# Patient Record
Sex: Female | Born: 1970 | Race: White | Hispanic: No | Marital: Married
Health system: Southern US, Community
[De-identification: ages and names within clinical notes are randomized; demographics above are authoritative.]

---

## 2010-06-01 ENCOUNTER — Other Ambulatory Visit (HOSPITAL_COMMUNITY)
Admission: RE | Admit: 2010-06-01 | Discharge: 2010-06-01 | Disposition: A | Discharge status: Home / Self Care | Payer: PRIVATE HEALTH INSURANCE | Source: Ambulatory Visit | Attending: Obstetrics and Gynecology | Admitting: Obstetrics and Gynecology

## 2010-06-01 DIAGNOSIS — Z113 Encounter for screening for infections with a predominantly sexual mode of transmission: Secondary | ICD-10-CM | POA: Insufficient documentation

## 2010-06-01 DIAGNOSIS — Z01419 Encounter for gynecological examination (general) (routine) without abnormal findings: Secondary | ICD-10-CM | POA: Insufficient documentation

## 2010-09-11 ENCOUNTER — Other Ambulatory Visit: Payer: Self-pay | Admitting: Obstetrics and Gynecology

## 2010-09-11 DIAGNOSIS — E2839 Other primary ovarian failure: Secondary | ICD-10-CM

## 2010-09-14 ENCOUNTER — Ambulatory Visit
Admission: RE | Admit: 2010-09-14 | Discharge: 2010-09-14 | Disposition: A | Discharge status: Home / Self Care | Payer: PRIVATE HEALTH INSURANCE | Source: Ambulatory Visit | Attending: Obstetrics and Gynecology | Admitting: Obstetrics and Gynecology

## 2010-09-14 DIAGNOSIS — E2839 Other primary ovarian failure: Secondary | ICD-10-CM

## 2012-02-04 ENCOUNTER — Other Ambulatory Visit: Payer: Self-pay | Admitting: Obstetrics and Gynecology

## 2012-02-04 DIAGNOSIS — Z1231 Encounter for screening mammogram for malignant neoplasm of breast: Secondary | ICD-10-CM

## 2012-03-05 ENCOUNTER — Ambulatory Visit
Admission: RE | Admit: 2012-03-05 | Discharge: 2012-03-05 | Disposition: A | Discharge status: Home / Self Care | Payer: BC Managed Care – PPO | Source: Ambulatory Visit | Attending: Obstetrics and Gynecology | Admitting: Obstetrics and Gynecology

## 2012-03-05 DIAGNOSIS — Z1231 Encounter for screening mammogram for malignant neoplasm of breast: Secondary | ICD-10-CM

## 2013-03-23 ENCOUNTER — Other Ambulatory Visit: Payer: Self-pay | Admitting: Obstetrics and Gynecology

## 2013-03-23 ENCOUNTER — Other Ambulatory Visit (HOSPITAL_COMMUNITY)
Admission: RE | Admit: 2013-03-23 | Discharge: 2013-03-23 | Disposition: A | Discharge status: Home / Self Care | Payer: BC Managed Care – PPO | Source: Ambulatory Visit | Attending: Obstetrics and Gynecology | Admitting: Obstetrics and Gynecology

## 2013-03-23 DIAGNOSIS — Z01419 Encounter for gynecological examination (general) (routine) without abnormal findings: Secondary | ICD-10-CM | POA: Insufficient documentation

## 2013-03-23 DIAGNOSIS — Z1151 Encounter for screening for human papillomavirus (HPV): Secondary | ICD-10-CM | POA: Insufficient documentation

## 2013-04-01 ENCOUNTER — Other Ambulatory Visit: Payer: Self-pay | Admitting: Physician Assistant

## 2013-04-01 ENCOUNTER — Ambulatory Visit
Admission: RE | Admit: 2013-04-01 | Discharge: 2013-04-01 | Disposition: A | Discharge status: Home / Self Care | Payer: BC Managed Care – PPO | Source: Ambulatory Visit | Attending: Physician Assistant | Admitting: Physician Assistant

## 2013-04-01 DIAGNOSIS — R52 Pain, unspecified: Secondary | ICD-10-CM

## 2013-08-25 ENCOUNTER — Other Ambulatory Visit: Payer: Self-pay | Admitting: Physician Assistant

## 2013-08-25 ENCOUNTER — Ambulatory Visit
Admission: RE | Admit: 2013-08-25 | Discharge: 2013-08-25 | Disposition: A | Discharge status: Home / Self Care | Payer: BC Managed Care – PPO | Source: Ambulatory Visit | Attending: Physician Assistant | Admitting: Physician Assistant

## 2013-08-25 DIAGNOSIS — M25562 Pain in left knee: Secondary | ICD-10-CM

## 2014-06-02 ENCOUNTER — Other Ambulatory Visit: Payer: Self-pay | Admitting: Family Medicine

## 2014-06-02 ENCOUNTER — Ambulatory Visit
Admission: RE | Admit: 2014-06-02 | Discharge: 2014-06-02 | Disposition: A | Discharge status: Home / Self Care | Payer: BLUE CROSS/BLUE SHIELD | Source: Ambulatory Visit | Attending: Family Medicine | Admitting: Family Medicine

## 2014-06-02 DIAGNOSIS — R52 Pain, unspecified: Secondary | ICD-10-CM

## 2014-08-16 ENCOUNTER — Other Ambulatory Visit: Payer: Self-pay

## 2014-08-16 DIAGNOSIS — Z1231 Encounter for screening mammogram for malignant neoplasm of breast: Secondary | ICD-10-CM

## 2014-09-22 ENCOUNTER — Ambulatory Visit
Admission: RE | Admit: 2014-09-22 | Discharge: 2014-09-22 | Disposition: A | Discharge status: Home / Self Care | Payer: BLUE CROSS/BLUE SHIELD | Source: Ambulatory Visit

## 2014-09-22 DIAGNOSIS — Z1231 Encounter for screening mammogram for malignant neoplasm of breast: Secondary | ICD-10-CM

## 2015-10-08 IMAGING — CR DG TOE GREAT 2+V*R*
3 series · 3 of 3 positions shown · non-contrast
Comparison: None.

CLINICAL DATA: Distal pain, question prior fracture 6196, no recent
trauma

EXAM:
RIGHT GREAT TOE

[t toes ap right]
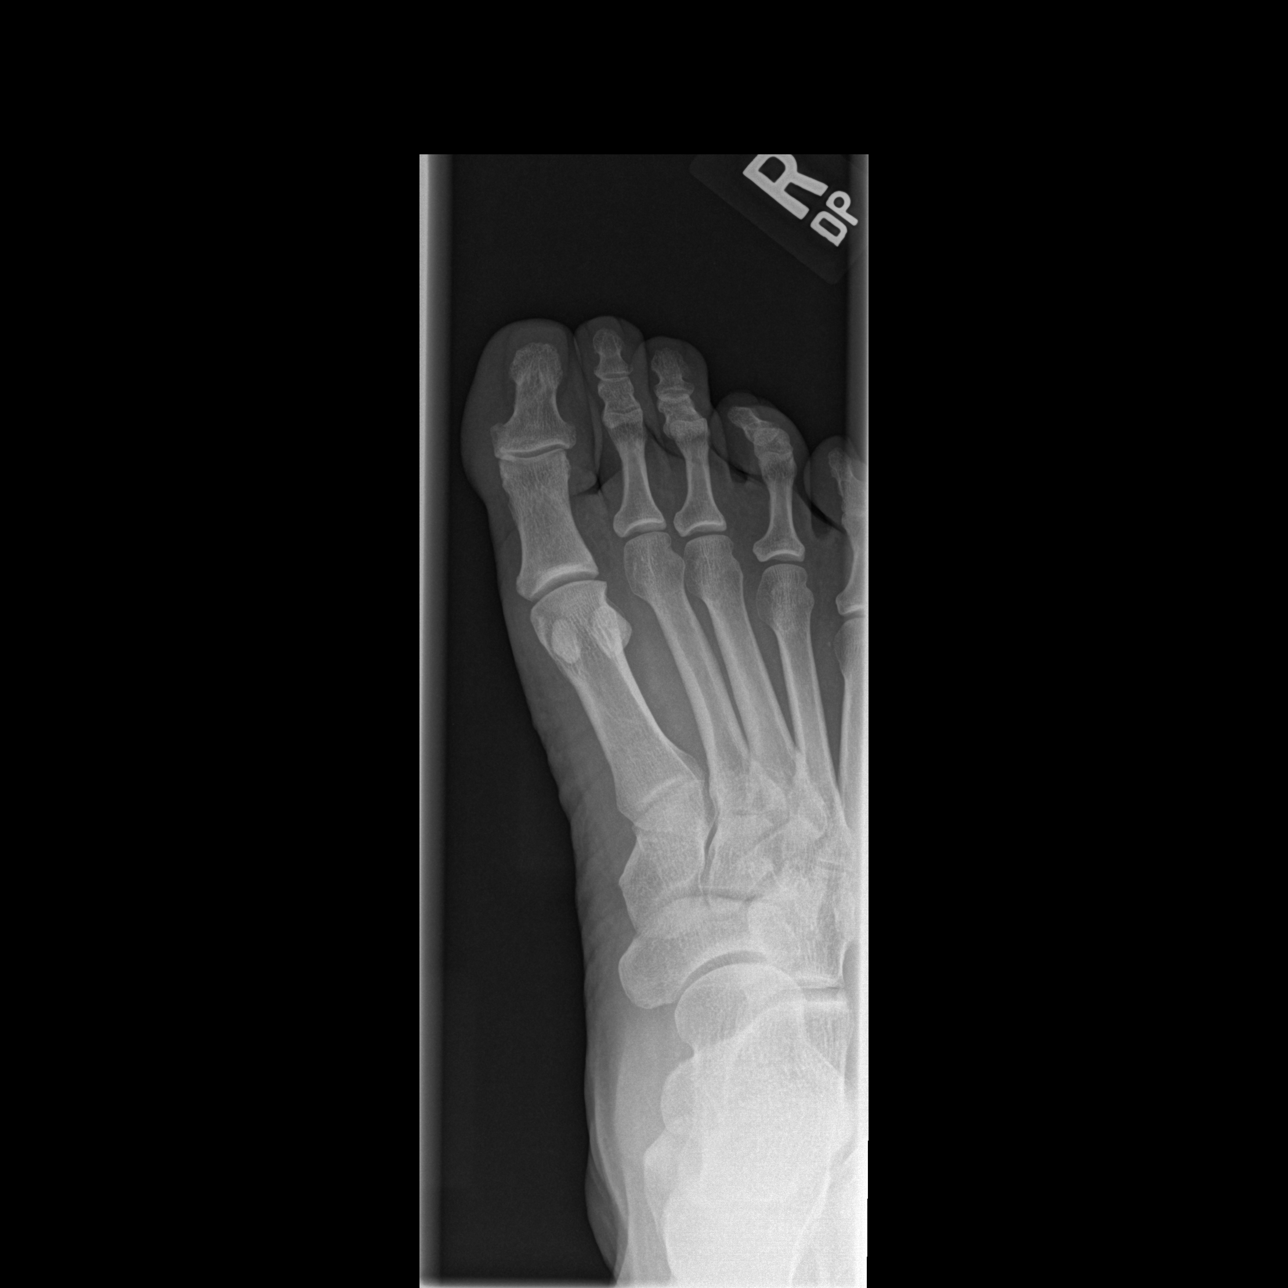

[t toes oblique right]
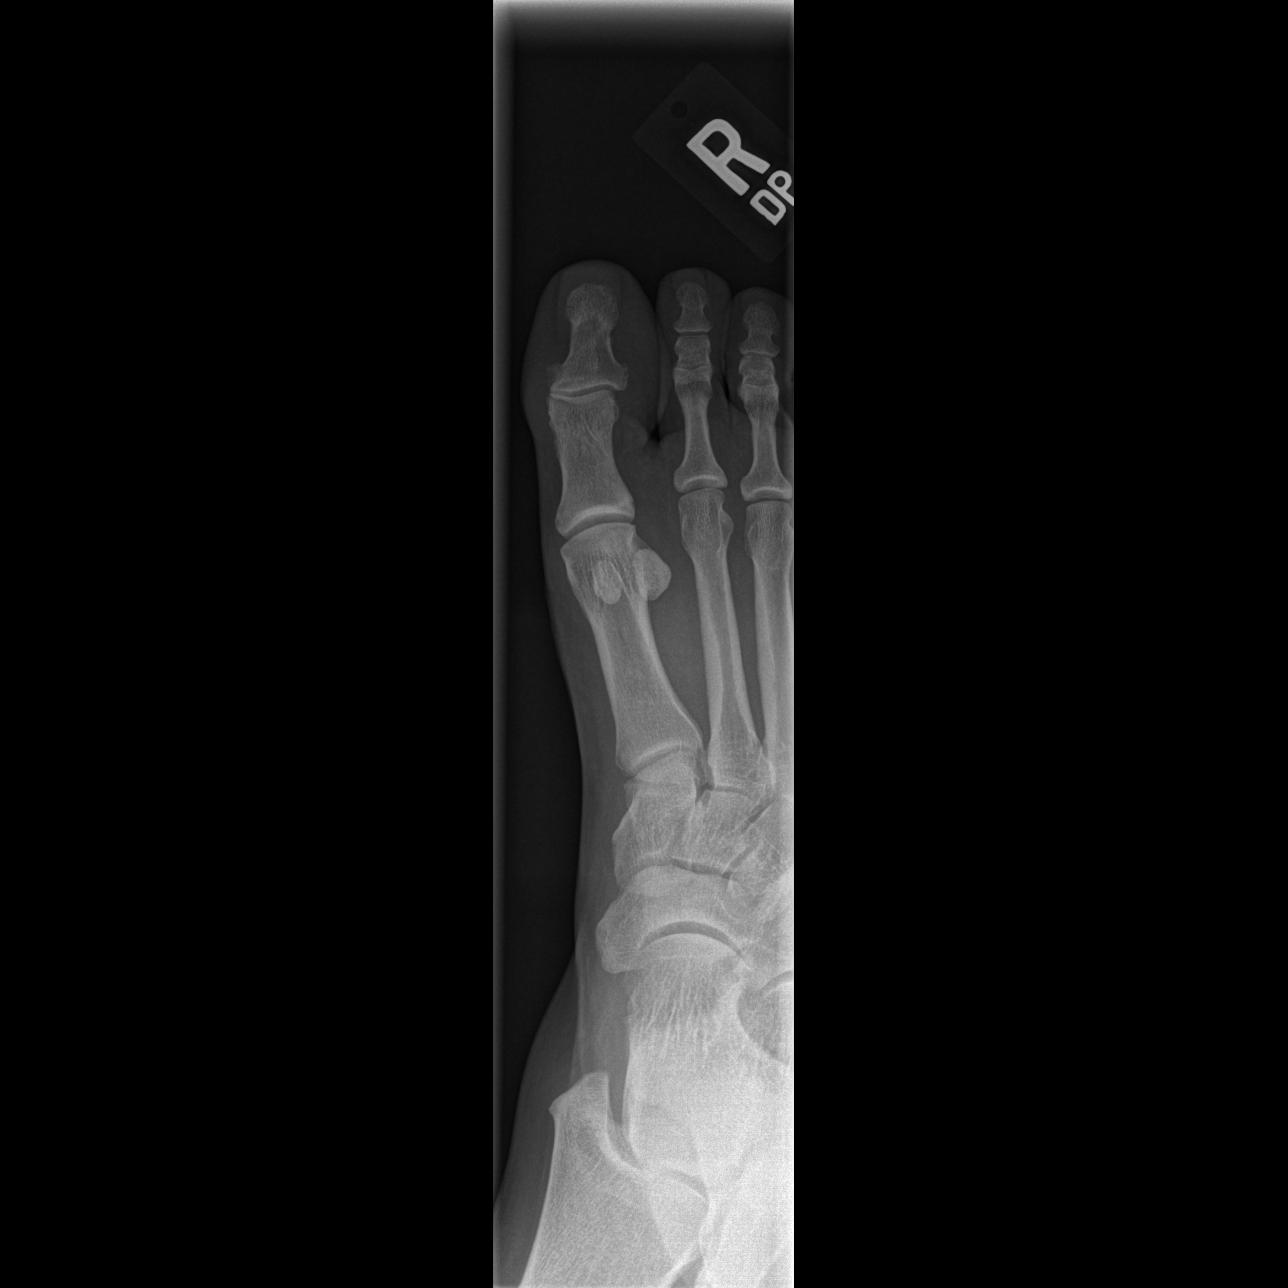

[t toes lateral right]
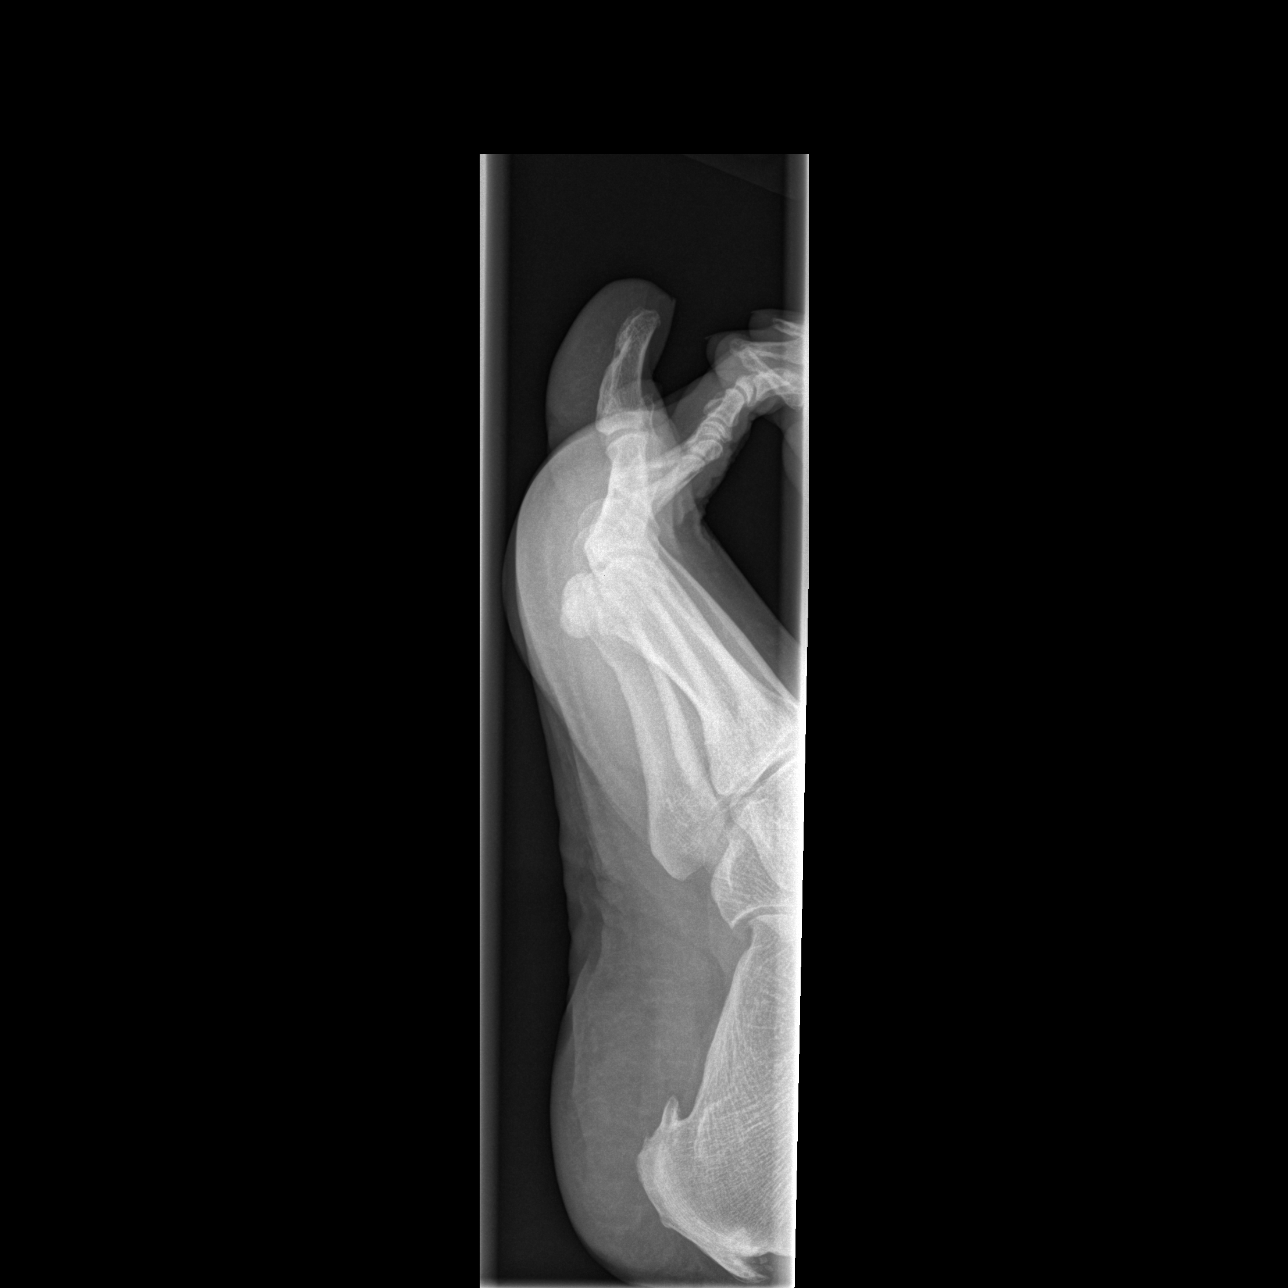

[3 of 3 positions shown; findings below may reference images not displayed]

FINDINGS: Three views of the right great toe submitted. No acute fracture or
subluxation. Mild degenerative changes interphalangeal joint. No
periosteal reaction or bony erosion
IMPRESSION: No acute fracture or subluxation. Mild degenerative changes
interphalangeal joint great toe.

## 2016-05-29 ENCOUNTER — Other Ambulatory Visit: Payer: Self-pay | Admitting: Nurse Practitioner

## 2016-05-30 LAB — CYTOLOGY - PAP
Diagnosis: NEGATIVE
HPV (WINDOPATH): NOT DETECTED
